# Patient Record
Sex: Male | Born: 1990 | Race: Black or African American | Hispanic: No | Marital: Married | Smoking: Never smoker
Health system: Southern US, Community
[De-identification: ages and names within clinical notes are randomized; demographics above are authoritative.]

## PROBLEM LIST (undated history)

## (undated) DIAGNOSIS — Z0289 Encounter for other administrative examinations: Secondary | ICD-10-CM

## (undated) HISTORY — DX: Encounter for other administrative examinations: Z02.89

---

## 2020-03-27 ENCOUNTER — Ambulatory Visit: Payer: Self-pay

## 2020-05-01 ENCOUNTER — Other Ambulatory Visit: Payer: Self-pay

## 2020-05-01 ENCOUNTER — Ambulatory Visit: Payer: Medicaid Other | Admitting: Family Medicine

## 2020-05-01 ENCOUNTER — Other Ambulatory Visit (HOSPITAL_COMMUNITY)
Admission: RE | Admit: 2020-05-01 | Discharge: 2020-05-01 | Disposition: A | Discharge status: Home / Self Care | Payer: Medicaid Other | Source: Ambulatory Visit | Attending: Family Medicine | Admitting: Family Medicine

## 2020-05-01 VITALS — BP 124/74 | HR 86 | Ht 68.0 in | Wt 200.2 lb

## 2020-05-01 DIAGNOSIS — Z0289 Encounter for other administrative examinations: Secondary | ICD-10-CM | POA: Diagnosis not present

## 2020-05-01 LAB — POCT URINALYSIS DIP (MANUAL ENTRY)
Bilirubin, UA: NEGATIVE
Blood, UA: NEGATIVE
Glucose, UA: NEGATIVE mg/dL
Ketones, POC UA: NEGATIVE mg/dL
Leukocytes, UA: NEGATIVE
Nitrite, UA: NEGATIVE
Protein Ur, POC: NEGATIVE mg/dL
Spec Grav, UA: 1.025 (ref 1.010–1.025)
Urobilinogen, UA: 0.2 E.U./dL
pH, UA: 6 (ref 5.0–8.0)

## 2020-05-01 NOTE — Patient Instructions (Addendum)
It was wonderful to see you today.   Today we talked about: --The COVID19 vaccine--at next visit!  - Consider riboflavin (a vitamin at 200 mg per day) or magnesium (200 mg per day) for your headaches   --Follow up on 9/29  At 2:45 PM     Thank you for choosing [facility].   Please call [phone] with any questions about today's appointment.  Please be sure to schedule follow up at the front  desk before you leave today.   Terisa Starr, MD  Family Medicine

## 2020-05-01 NOTE — Progress Notes (Signed)
Patient Name: Jesus Clay Date of Birth: 1991-05-15 Date of Visit: 05/01/20 PCP: Westley Chandler, MD  Chief Complaint: refugee intake examination and headaches  The patient's preferred language is [place]. An interpreter was used for the entire visit.  Interpreter Name or ID: Rayna Sexton     Subjective: Jesus Clay is a pleasant 29 y.o. presenting today for an initial refugee and immigrant clinic visit.    Headache: Patient reports intermittent headaches in the last four months lasting about a day occurring ~once per week along the right temple. He believes they were instigated by previously working multiple jobs. Worsened by loud noises and when trying to concentrate. Preceded by aura of tingling lights. He stays hydrated, and reports adequate sleep. He has not tried anything for it, and desires to limit medication use. Denies photophobia.   Fingernail pain: He reports pain in fingernails of both hands beginning three weeks ago, that started in one finger and spread to the rest. Endorses peeling of skin of fingers. Believes it is related to use of new soaps and detergent. Sensation of touch is intact. He consumes a varied, inclusive diet. Denies swimming in pool. No numbness or tingling. No shortness of breath, lightheadedness, or dizziness.      ROS:   No fevers, sob, cough, bleeding, chest pain, n/v, back pain, abdominal pain, dysuria, hematuria, lightheadedness, dizziness, urgency, frequency, diarrhea, constipation, anorexia.    PMH: None    PSH: None   FH: Mom: Diabetes Mellitus (diagnosed in late adulthood; presumably Type II)  Dad: Hypertension  Denies FHx of cancers  Current Medications:  None (No prescribed meds, vitamins, herbs, or OTCs)  Refugee Information Number of Immediate Family Members: 8 (Lives with 3 brothers, one sister, parents. Wife and son in [place]) Number of Immediate Family Members in [place]: 6 Date of Arrival: 04/04/20 Country of Birth:  [facility] Country of Origin:  ([place]) Duration in [place]: 20 years or greater (25) Reason for Leaving Home Country: Other Other Reason for Leaving Home Country:: war Primary Language: French Able to Read in Primary Language: Yes Able to Write in Primary Language: No (However writes in [place] and [place], some [place]) Education: [employer] Prior Work: Radiographer, therapeutic, Financial risk analyst, Leisure centre manager, Teacher, adult education, Interior and spatial designer Marital Status: Married Sexual Activity: Yes Tuberculosis Screening Overseas: Negative Tuberculosis Screening Health Department: Negative Health Department Labs Completed: Yes History of Trauma: None Do You Feel Jumpy or Nervous?: No Are You Very Watchful or 'Super Alert'?: No   Date of [place]verse[place] Exam: 12/15/19 Review of [place]verse[place] Exam: Today  Pre-Departure Treatment: Coarten, Praziquantel, Albendazole   Seen at HD---received vaccines 04/23/20    Vitals:   05/01/20 1337  BP: 124/74  Pulse: 86  SpO2: 100%   HEENT: Sclera anicteric. Dentition is appropriate . Appears well hydrated. Cardiac: Regular rate and rhythm. Normal S1/S2. No murmurs, rubs, or gallops appreciated. Lungs: Clear bilaterally to ascultation.  Abdomen: Normoactive bowel sounds. No tenderness to deep or light palpation. No rebound or guarding. Splenomegaly absent  Extremities: Warm, well perfused without edema. All fingertips erythematous, with scaling and peeling stopping just distal to DIP Skin: Warm and well perfused.  On bilateral hands there is keratolysis  No edema, no pain Keratolysis only present over DIP on all digits, no scaling No overlying flaking No involvement of dorsum of hand  Psych: Pleasant and appropriate  MSK: Normal ROM. 5/5 Strength Neuro: Alert and responsive. No focal deficits. Appropriate gait.    Roderick was seen today for establish care.  Diagnoses  and all orders for this visit:  Refugee health examination -     RPR -     HIV Antibody (routine testing w rflx) -      CBC with Differential/Platelet -     Comprehensive metabolic panel -     Varicella zoster antibody, IgG -     Hepatitis B surface antigen -     Hepatitis B core antibody, total -     Hepatitis B surface antibody,quantitative -     POCT urinalysis dipstick -     Lipid panel -     TSH -     Urine cytology ancillary only -     Strongyloides, Ab, IgG- given no ivermectin predeparture as from [place] endemic area  -     HCV Ab w Reflex to Quant PCR   Assessment and Plan  -Headaches: Likely migraines. No red flags concerning for malignancy, less likely tension, or medicatin overuse.  Patient to try OTC Riboflavin at 200 mg per day or Magnesium at 200 mg per day, track, and f/u at next visit.  -Exfoliative keratolysis, also query if dermatitis from irritant. Low concern for nutritional deficiencies. CBC, HIV, and RPR Today. Keep monitoring  Return to care in 1 month in [facility] with resident physician and PCP Dr. Terisa Starr.   Release of records signed for HD Discuss COVID vaccine at follow up   Vaccines: Discussed vaccine at length, regarding benefits, safety profile, types, and recommendations for COVID19 vaccine. Amenable to receiving at follow up.    Patient seen along with MS3 student Daryll Drown. I personally evaluated this patient along with the student, and verified all aspects of the history, physical exam, and medical decision making as documented by the student. I agree with the student's documentation and have made all necessary edits.  Terisa Starr, MD  Family Medicine Teaching Service

## 2020-05-02 ENCOUNTER — Other Ambulatory Visit: Payer: Medicaid Other

## 2020-05-02 ENCOUNTER — Other Ambulatory Visit: Payer: Self-pay | Admitting: Family Medicine

## 2020-05-02 DIAGNOSIS — Z0289 Encounter for other administrative examinations: Secondary | ICD-10-CM

## 2020-05-03 ENCOUNTER — Encounter: Payer: Self-pay | Admitting: Family Medicine

## 2020-05-03 LAB — URINE CYTOLOGY ANCILLARY ONLY
Chlamydia: NEGATIVE
Comment: NEGATIVE
Comment: NORMAL
Neisseria Gonorrhea: NEGATIVE

## 2020-05-04 LAB — LIPID PANEL
Chol/HDL Ratio: 5.1 ratio — ABNORMAL HIGH (ref 0.0–5.0)
Cholesterol, Total: 204 mg/dL — ABNORMAL HIGH (ref 100–199)
HDL: 40 mg/dL (ref >39)
LDL Chol Calc (NIH): 147 mg/dL — ABNORMAL HIGH (ref 0–99)
Triglycerides: 96 mg/dL (ref 0–149)
VLDL Cholesterol Cal: 17 mg/dL (ref 5–40)

## 2020-05-04 LAB — COMPREHENSIVE METABOLIC PANEL
ALT: 41 IU/L (ref 0–44)
AST: 24 IU/L (ref 0–40)
Albumin/Globulin Ratio: 2 (ref 1.2–2.2)
Albumin: 4.7 g/dL (ref 4.1–5.2)
Alkaline Phosphatase: 80 IU/L (ref 48–121)
BUN/Creatinine Ratio: 10 (ref 9–20)
BUN: 11 mg/dL (ref 6–20)
Bilirubin Total: 0.2 mg/dL (ref 0.0–1.2)
CO2: 20 mmol/L (ref 20–29)
Calcium: 9.4 mg/dL (ref 8.7–10.2)
Chloride: 103 mmol/L (ref 96–106)
Creatinine, Ser: 1.12 mg/dL (ref 0.76–1.27)
GFR calc Af Amer: 104 mL/min/{1.73_m2} (ref >59)
GFR calc non Af Amer: 90 mL/min/{1.73_m2} (ref >59)
Globulin, Total: 2.4 g/dL (ref 1.5–4.5)
Glucose: 104 mg/dL — ABNORMAL HIGH (ref 65–99)
Potassium: 4 mmol/L (ref 3.5–5.2)
Sodium: 138 mmol/L (ref 134–144)
Total Protein: 7.1 g/dL (ref 6.0–8.5)

## 2020-05-04 LAB — CBC WITH DIFFERENTIAL/PLATELET
Basophils Absolute: 0 10*3/uL (ref 0.0–0.2)
Basos: 1 %
EOS (ABSOLUTE): 0.1 10*3/uL (ref 0.0–0.4)
Eos: 3 %
Hematocrit: 43.8 % (ref 37.5–51.0)
Hemoglobin: 15.1 g/dL (ref 13.0–17.7)
Immature Grans (Abs): 0 10*3/uL (ref 0.0–0.1)
Immature Granulocytes: 0 %
Lymphocytes Absolute: 2.2 10*3/uL (ref 0.7–3.1)
Lymphs: 46 %
MCH: 30.1 pg (ref 26.6–33.0)
MCHC: 34.5 g/dL (ref 31.5–35.7)
MCV: 87 fL (ref 79–97)
Monocytes Absolute: 0.4 10*3/uL (ref 0.1–0.9)
Monocytes: 9 %
Neutrophils Absolute: 2 10*3/uL (ref 1.4–7.0)
Neutrophils: 41 %
Platelets: 235 10*3/uL (ref 150–450)
RBC: 5.02 x10E6/uL (ref 4.14–5.80)
RDW: 13.6 % (ref 11.6–15.4)
WBC: 4.8 10*3/uL (ref 3.4–10.8)

## 2020-05-04 LAB — TSH: TSH: 1.43 u[IU]/mL (ref 0.450–4.500)

## 2020-05-04 LAB — HIV ANTIBODY (ROUTINE TESTING W REFLEX): HIV Screen 4th Generation wRfx: NONREACTIVE

## 2020-05-04 LAB — HEPATITIS B SURFACE ANTIBODY, QUANTITATIVE: Hepatitis B Surf Ab Quant: >1000 m[IU]/mL (ref >9.9)

## 2020-05-04 LAB — HCV INTERPRETATION

## 2020-05-04 LAB — HEPATITIS B SURFACE ANTIGEN: Hepatitis B Surface Ag: NEGATIVE

## 2020-05-04 LAB — HCV AB W REFLEX TO QUANT PCR: HCV Ab: <0.1 s/co ratio (ref 0.0–0.9)

## 2020-05-04 LAB — STRONGYLOIDES, AB, IGG: Strongyloides, Ab, IgG: NEGATIVE

## 2020-05-04 LAB — VARICELLA ZOSTER ANTIBODY, IGG: Varicella zoster IgG: <135 index — ABNORMAL LOW (ref >165)

## 2020-05-04 LAB — HEPATITIS B CORE ANTIBODY, TOTAL: Hep B Core Total Ab: NEGATIVE

## 2020-05-04 LAB — RPR: RPR Ser Ql: NONREACTIVE

## 2020-05-11 ENCOUNTER — Encounter: Payer: Self-pay | Admitting: Family Medicine

## 2020-05-21 ENCOUNTER — Encounter: Payer: Self-pay | Admitting: Family Medicine

## 2020-05-21 DIAGNOSIS — R7612 Nonspecific reaction to cell mediated immunity measurement of gamma interferon antigen response without active tuberculosis: Secondary | ICD-10-CM | POA: Insufficient documentation

## 2020-05-30 ENCOUNTER — Other Ambulatory Visit: Payer: Self-pay

## 2020-05-30 ENCOUNTER — Ambulatory Visit (INDEPENDENT_AMBULATORY_CARE_PROVIDER_SITE_OTHER): Payer: Medicaid Other | Admitting: Family Medicine

## 2020-05-30 ENCOUNTER — Encounter: Payer: Self-pay | Admitting: Family Medicine

## 2020-05-30 VITALS — BP 122/80 | HR 96 | Ht 67.99 in | Wt 199.8 lb

## 2020-05-30 DIAGNOSIS — Z23 Encounter for immunization: Secondary | ICD-10-CM

## 2020-05-30 DIAGNOSIS — Z09 Encounter for follow-up examination after completed treatment for conditions other than malignant neoplasm: Secondary | ICD-10-CM | POA: Insufficient documentation

## 2020-05-30 NOTE — Assessment & Plan Note (Addendum)
Headaches -Resolved; patient is non-medicated  Finger rash/pain -Resolved  Immunizatons: -Varicella administered today -COVID offered; patient declined, will make follow up appt when ready to receive vaccine

## 2020-05-30 NOTE — Patient Instructions (Addendum)
It was wonderful to see you today.  Today we talked about:  Your headaches they have resolved as well as the rash on your hand.  If this returns please let us know.  Today you received your varicella vaccine.  Please be sure to schedule follow up for your Covid vaccine at the front  desk before you leave today.   Please call the clinic at [phone] if you have any concerns. It was our pleasure to serve you.  Dr. Salvadore Dom

## 2020-05-30 NOTE — Progress Notes (Signed)
    SUBJECTIVE:   CHIEF COMPLAINT / HPI:   Jesus Clay is a 29 yo M who presents for follow up; no concerns today.  Headaches Have resolved. Denies vision changes or neck stiffness. Not currently taking any medication.  Digital rash Has resolved on its own with the last few weeks.   Immunizations Not sure if he has received the varicella vaccine.  PERTINENT  PMH / PSH: Refugee (Health examination with Dr. 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 Age appropriate. PHQ9 SCORE ONLY 05/30/2020 05/01/2020  PHQ-9 Total Score 1 4   ASSESSMENT/PLAN:   Follow up Headaches -Resolved; patient is non-medicated  Finger rash/pain -Resolved  Immunizatons: -Varicella administered today -COVID offered; patient declined, will make follow up appt when ready to receive vaccine   Lavonda Jumbo, DO [facility]

## 2020-06-29 ENCOUNTER — Other Ambulatory Visit: Payer: Self-pay | Admitting: Obstetrics and Gynecology

## 2020-06-29 ENCOUNTER — Other Ambulatory Visit: Payer: Self-pay

## 2020-06-29 ENCOUNTER — Ambulatory Visit
Admission: RE | Admit: 2020-06-29 | Discharge: 2020-06-29 | Disposition: A | Discharge status: Home / Self Care | Payer: Medicaid Other | Source: Ambulatory Visit | Attending: Obstetrics and Gynecology | Admitting: Obstetrics and Gynecology

## 2020-06-29 DIAGNOSIS — R7611 Nonspecific reaction to tuberculin skin test without active tuberculosis: Secondary | ICD-10-CM

## 2020-09-02 ENCOUNTER — Other Ambulatory Visit: Payer: Self-pay

## 2020-09-02 ENCOUNTER — Emergency Department (HOSPITAL_COMMUNITY)
Admission: EM | Admit: 2020-09-02 | Discharge: 2020-09-02 | Disposition: A | Discharge status: Home / Self Care | Payer: Medicaid Other | Attending: Emergency Medicine | Admitting: Emergency Medicine

## 2020-09-02 ENCOUNTER — Encounter (HOSPITAL_COMMUNITY): Payer: Self-pay | Admitting: Emergency Medicine

## 2020-09-02 ENCOUNTER — Ambulatory Visit (HOSPITAL_COMMUNITY)
Admission: EM | Admit: 2020-09-02 | Discharge: 2020-09-02 | Disposition: A | Discharge status: Home / Self Care | Payer: Medicaid Other | Attending: Family Medicine | Admitting: Family Medicine

## 2020-09-02 DIAGNOSIS — Z5321 Procedure and treatment not carried out due to patient leaving prior to being seen by health care provider: Secondary | ICD-10-CM | POA: Insufficient documentation

## 2020-09-02 DIAGNOSIS — H66002 Acute suppurative otitis media without spontaneous rupture of ear drum, left ear: Secondary | ICD-10-CM

## 2020-09-02 DIAGNOSIS — H9201 Otalgia, right ear: Secondary | ICD-10-CM | POA: Insufficient documentation

## 2020-09-02 MED ORDER — AMOXICILLIN-POT CLAVULANATE 875-125 MG PO TABS: 1.0000 | ORAL_TABLET | Freq: Two times a day (BID) | ORAL | 0 refills | Status: DC

## 2020-09-02 MED ORDER — IBUPROFEN 800 MG PO TABS: 800.0000 mg | ORAL_TABLET | Freq: Three times a day (TID) | ORAL | 0 refills | Status: DC | PRN

## 2020-09-02 MED ORDER — ACETAMINOPHEN 500 MG PO TABS: 500.0000 mg | ORAL_TABLET | Freq: Four times a day (QID) | ORAL | 0 refills | Status: AC | PRN

## 2020-09-02 NOTE — ED Triage Notes (Signed)
Patient complains of right ear x2 months that became more severe yesterday.

## 2020-09-02 NOTE — ED Provider Notes (Signed)
[facility]    CSN: [id number] Arrival date & time: 09/02/20  1623      History   Chief Complaint Chief Complaint  Patient presents with  . Ear Pain    HPI Jesus Clay is a 30 y.o. male.   Here today with 1 month of right ear pain and muffled hearing, now with associated fever, jaw pain and headache on that side as well. Denies drainage from the ear, injury to the area, hx of ear issues. Has not been taking anything OTC for sxs. No known sick contacts.      Past Medical History:  Diagnosis Date  . Refugee health examination     Patient Active Problem List   Diagnosis Date Noted  . Follow up 05/30/2020  . 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 Refugee health examination 05/01/2020  . Morbid obesity (HCC) 05/01/2020    History reviewed. No pertinent surgical history.     Home Medications    Prior to Admission medications   Medication Sig Start Date End Date Taking? Authorizing Provider  acetaminophen (TYLENOL) 500 MG tablet Take 1 tablet (500 mg total) by mouth every 6 (six) hours as needed. 09/02/20  Yes Particia Nearing, PA-C  amoxicillin-clavulanate (AUGMENTIN) 875-125 MG tablet Take 1 tablet by mouth every 12 (twelve) hours. 09/02/20  Yes Particia Nearing, PA-C  ibuprofen (ADVIL) 800 MG tablet Take 1 tablet (800 mg total) by mouth every 8 (eight) hours as needed. 09/02/20  Yes Particia Nearing, PA-C    Family History Family History  Problem Relation Age of Onset  . Diabetes type II Mother   . Hypertension Father     Social History Social History   Tobacco Use  . Smoking status: Never Smoker  . Smokeless tobacco: Never Used  Substance Use Topics  . Alcohol use: Never  . Drug use: Never     Allergies   Patient has no known allergies.   Review of Systems Review of Systems PER HPI   Physical Exam Triage Vital Signs ED Triage Vitals  Enc Vitals Group     BP 09/02/20 1729 125/79     Pulse Rate  09/02/20 1729 (!) 55     Resp 09/02/20 1729 14     Temp 09/02/20 1729 99.6 F (37.6 C)     Temp Source 09/02/20 1729 Oral     SpO2 09/02/20 1729 99 %     Weight --      Height --      Head Circumference --      Peak Flow --      Pain Score 09/02/20 1730 8     Pain Loc --      Pain Edu? --      Excl. in GC? --    No data found.  Updated Vital Signs BP 125/79 (BP Location: Right Arm)   Pulse (!) 55   Temp 99.6 F (37.6 C) (Oral)   Resp 14   SpO2 99%   Visual Acuity Right Eye Distance:   Left Eye Distance:   Bilateral Distance:    Right Eye Near:   Left Eye Near:    Bilateral Near:     Physical Exam Vitals and nursing note reviewed.  Constitutional:      Appearance: Normal appearance.  HENT:     Head: Atraumatic.     Left Ear: Tympanic membrane normal.     Ears:     Comments: Right EAC edematous and mildly  ttp, no discharge or flaking. Right TM bulging and erythematous but intact    Nose: Nose normal.     Mouth/Throat:     Mouth: Mucous membranes are moist.     Pharynx: Oropharynx is clear.  Eyes:     Extraocular Movements: Extraocular movements intact.     Conjunctiva/sclera: Conjunctivae normal.  Cardiovascular:     Rate and Rhythm: Normal rate and regular rhythm.  Pulmonary:     Effort: Pulmonary effort is normal.     Breath sounds: Normal breath sounds.  Musculoskeletal:        General: Normal range of motion.     Cervical back: Normal range of motion and neck supple.  Skin:    General: Skin is warm and dry.  Neurological:     General: No focal deficit present.     Mental Status: He is oriented to person, place, and time.  Psychiatric:        Mood and Affect: Mood normal.        Thought Content: Thought content normal.        Judgment: Judgment normal.      UC Treatments / Results  Labs (all labs ordered are listed, but only abnormal results are displayed) Labs Reviewed - No data to display  EKG   Radiology No results  found.  Procedures Procedures (including critical care time)  Medications Ordered in UC Medications - No data to display  Initial Impression / Assessment and Plan / UC Course  I have reviewed the triage vital signs and the nursing notes.  Pertinent labs & imaging results that were available during my care of the patient were reviewed by me and considered in my medical decision making (see chart for details).     Suspect both otitis externa and media of right side. Will treat with augmentin, OTC pain relievers, rest. WOrk note given. Return for re-evaluation if not resolving.   Final Clinical Impressions(s) / UC Diagnoses   Final diagnoses:  Non-recurrent acute suppurative otitis media of left ear without spontaneous rupture of tympanic membrane   Discharge Instructions   None    ED Prescriptions    Medication Sig Dispense Auth. Provider   amoxicillin-clavulanate (AUGMENTIN) 875-125 MG tablet Take 1 tablet by mouth every 12 (twelve) hours. 20 tablet Particia Nearing, [place]   ibuprofen (ADVIL) 800 MG tablet Take 1 tablet (800 mg total) by mouth every 8 (eight) hours as needed. 21 tablet Particia Nearing, [place]   acetaminophen (TYLENOL) 500 MG tablet Take 1 tablet (500 mg total) by mouth every 6 (six) hours as needed. 30 tablet Particia Nearing, [place]     PDMP not reviewed this encounter.   Particia Nearing, [place] 09/02/20 1946

## 2020-09-02 NOTE — ED Triage Notes (Signed)
PT C/O: right ear pain onset 1 month associated w/headache, fever and is now having trouble opening his mouth  Hurts to sleep on his right side  Pt also taking meds for TB  DENIES: v/n/d  TAKING MEDS: none  A&O x4... NAD... Ambulatory

## 2021-01-02 IMAGING — CR DG CHEST 2V
2 series · 2 of 2 positions shown · non-contrast
Comparison: None.

CLINICAL DATA: Positive TB testing, no complaints.

EXAM:
CHEST - 2 VIEW

[w chest pa]
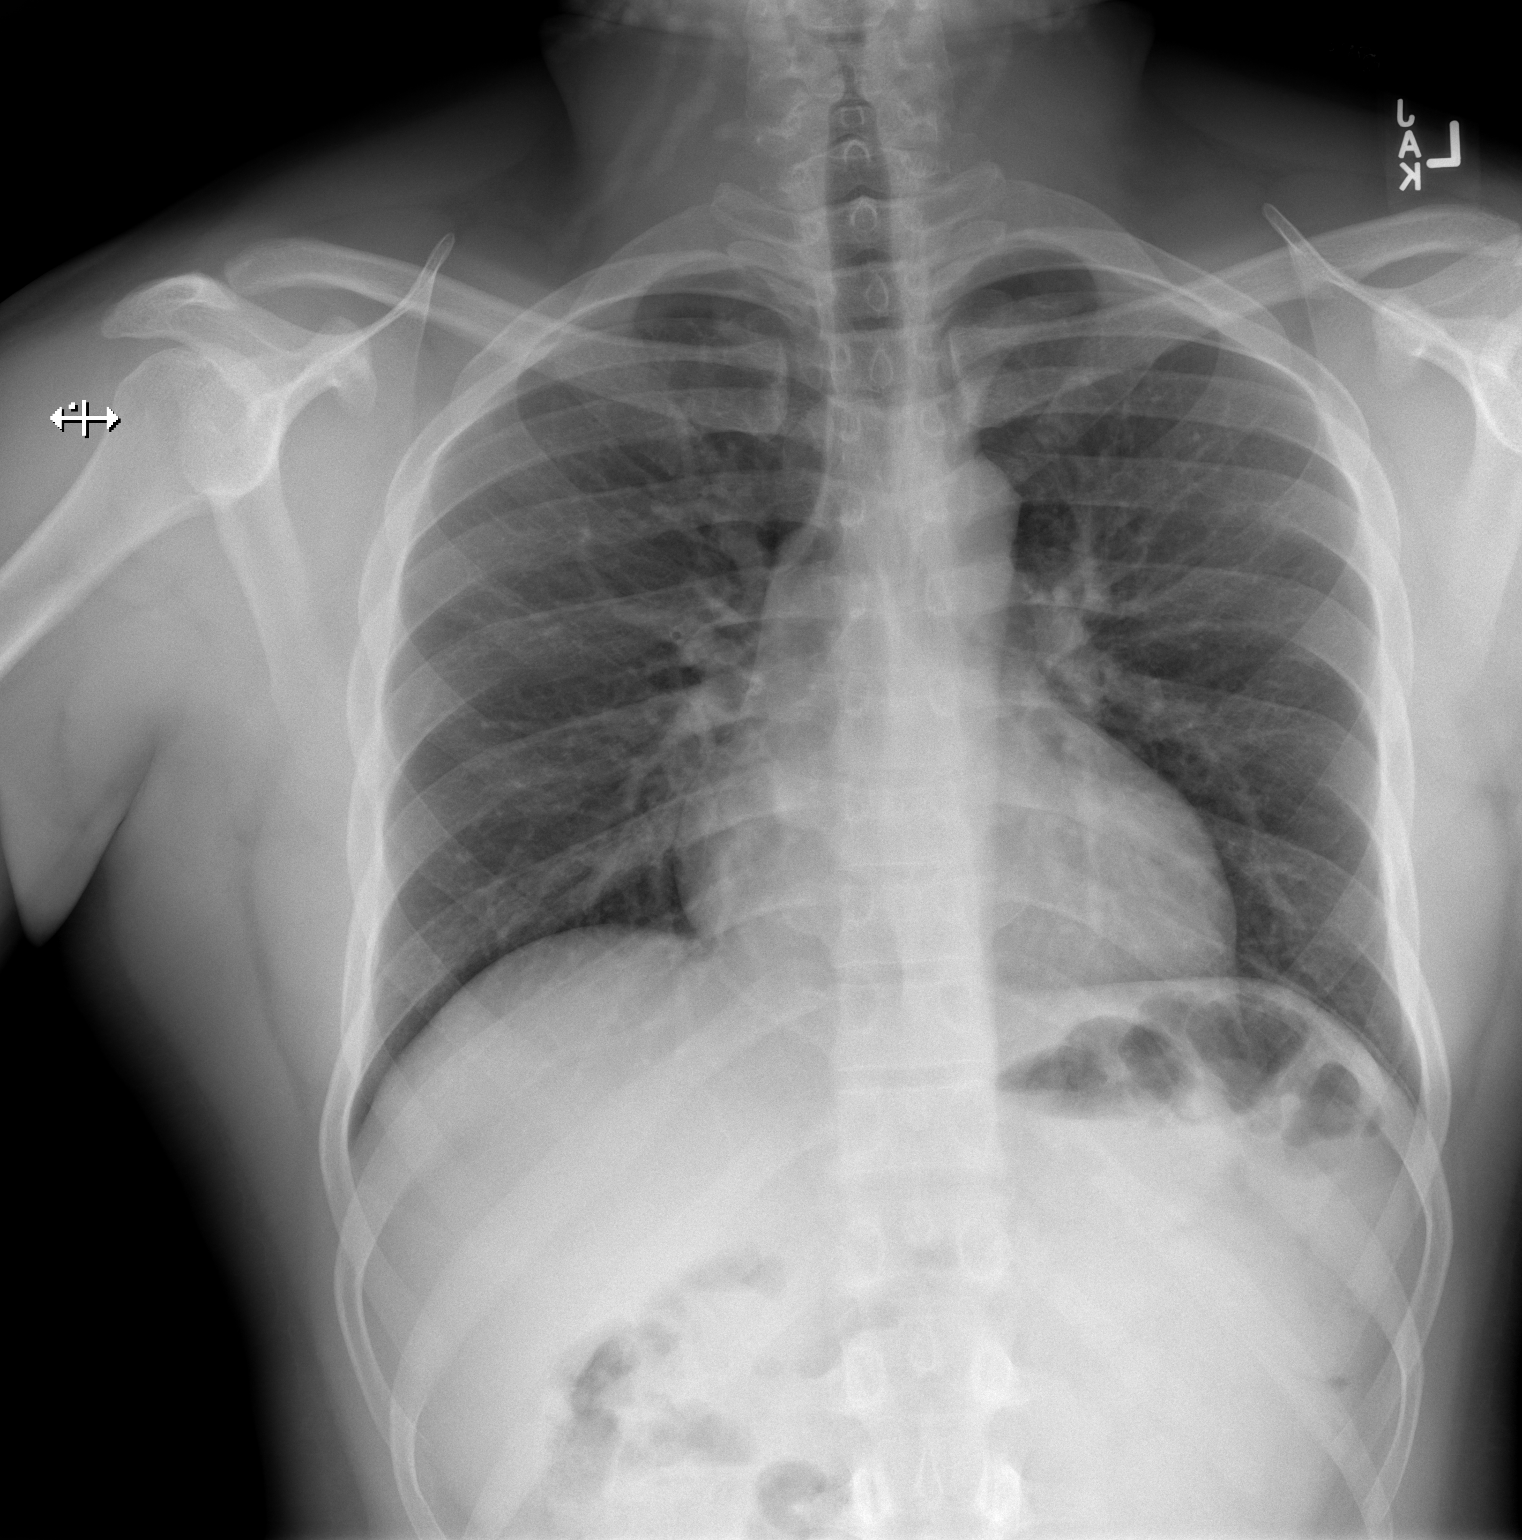

[w chest lat]
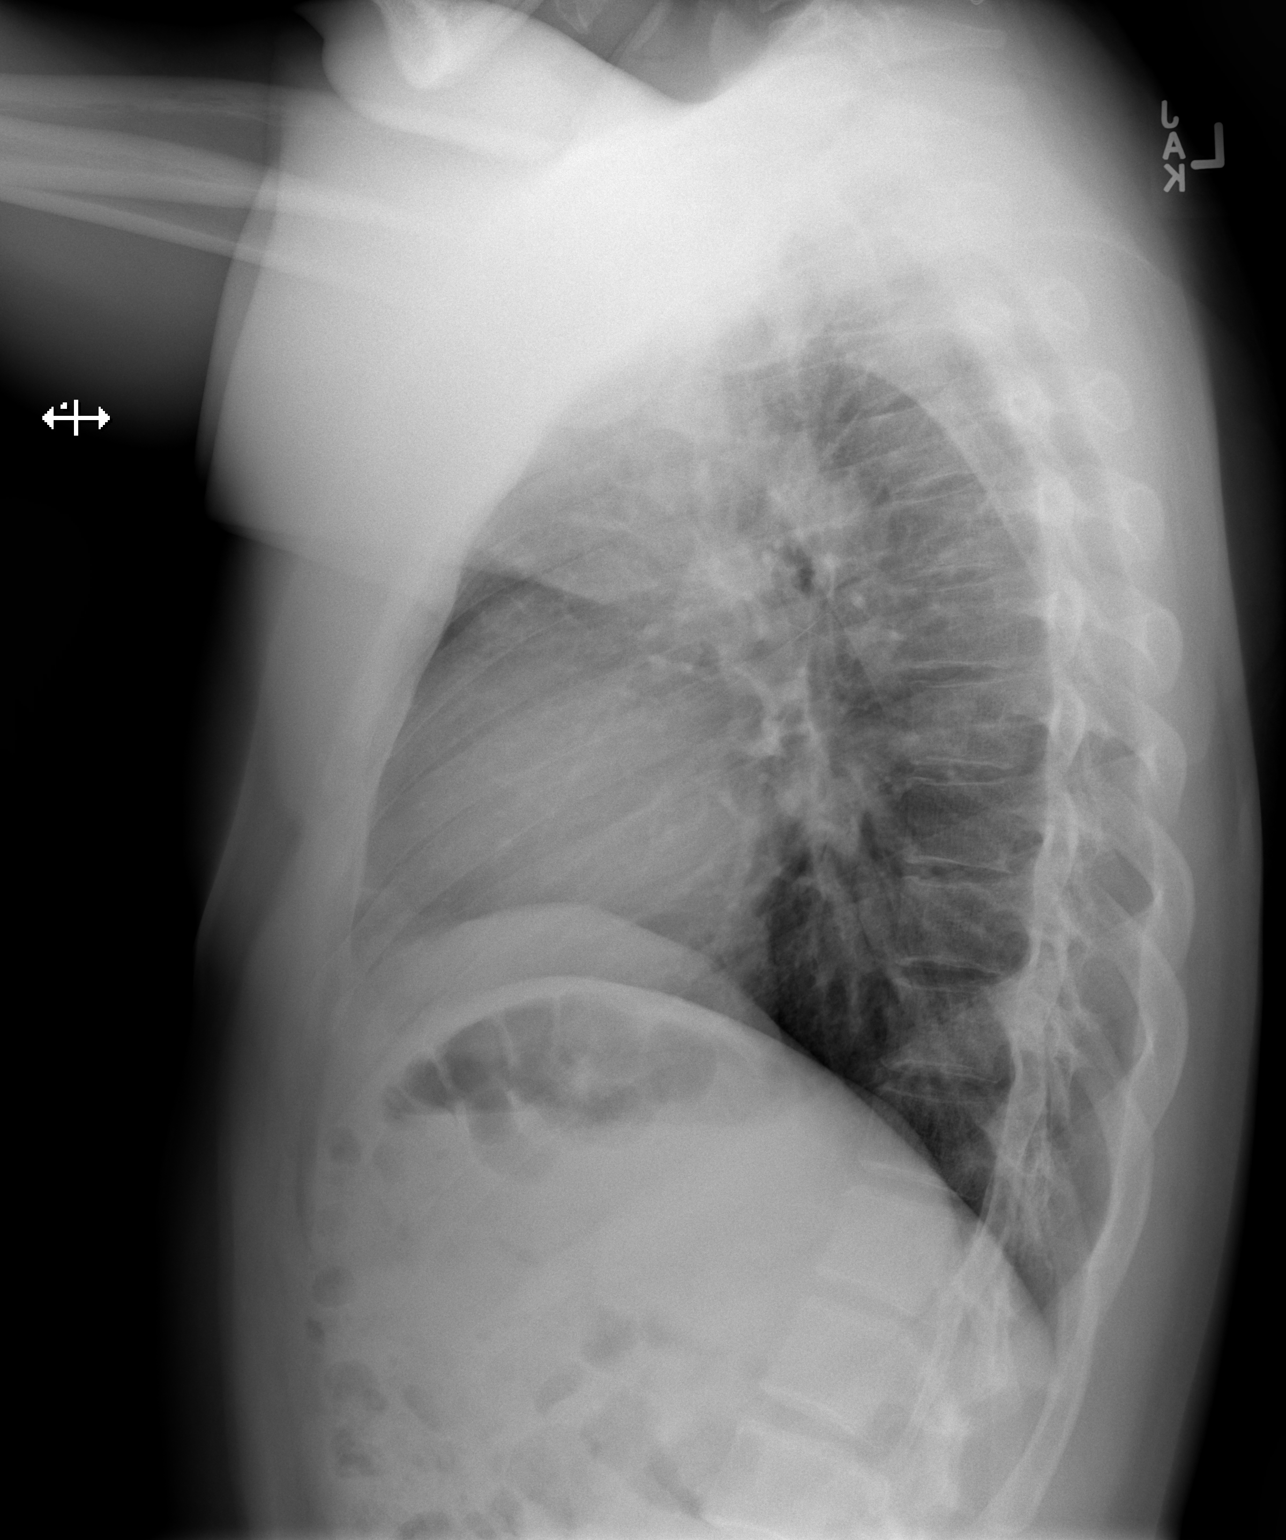

[2 of 2 positions shown; findings below may reference images not displayed]

FINDINGS: No convincing radiographic sequela of prior granulomatous disease.
No consolidation, features of edema, pneumothorax, or effusion.
Pulmonary vascularity is normally distributed. The cardiomediastinal
contours are unremarkable. No acute osseous or soft tissue
abnormality.
IMPRESSION: No acute cardiopulmonary abnormality. No radiographic evidence of
prior granulomatous disease.

## 2021-01-04 ENCOUNTER — Encounter (HOSPITAL_COMMUNITY): Payer: Self-pay

## 2021-01-04 ENCOUNTER — Ambulatory Visit (HOSPITAL_COMMUNITY)
Admission: RE | Admit: 2021-01-04 | Discharge: 2021-01-04 | Disposition: A | Discharge status: Home / Self Care | Payer: BC Managed Care – PPO | Source: Ambulatory Visit | Attending: Emergency Medicine | Admitting: Emergency Medicine

## 2021-01-04 ENCOUNTER — Other Ambulatory Visit: Payer: Self-pay

## 2021-01-04 VITALS — BP 120/67 | HR 67 | Temp 98.5°F | Resp 17

## 2021-01-04 DIAGNOSIS — H6690 Otitis media, unspecified, unspecified ear: Secondary | ICD-10-CM | POA: Diagnosis not present

## 2021-01-04 DIAGNOSIS — H60501 Unspecified acute noninfective otitis externa, right ear: Secondary | ICD-10-CM

## 2021-01-04 MED ORDER — NEOMYCIN-POLYMYXIN-HC 3.5-10000-1 OT SUSP: 3.0000 [drp] | Freq: Three times a day (TID) | OTIC | 0 refills | Status: AC

## 2021-01-04 MED ORDER — AMOXICILLIN-POT CLAVULANATE 875-125 MG PO TABS: 1.0000 | ORAL_TABLET | Freq: Two times a day (BID) | ORAL | 0 refills | Status: AC

## 2021-01-04 NOTE — ED Triage Notes (Signed)
Pt presents recurring right side ear pain.

## 2021-01-04 NOTE — ED Provider Notes (Signed)
[facility]    CSN: [id number] Arrival date & time: 01/04/21  1607      History   Chief Complaint Chief Complaint  Patient presents with  . APPOINTMENT: Ear Pain    HPI Jesus Clay is a 30 y.o. male.   Jesus Clay presents with complaints of right ear pain which started two days ago. No drainage from the ear. Pain radiates to head and neck. Feels similar to episode a few months ago with similar, antibiotics helped with pain. No specific dental pain. Some decreased hearing. No recent URI symptoms.    ROS per HPI, negative if not otherwise mentioned.      Past Medical History:  Diagnosis Date  . Refugee health examination     Patient Active Problem List   Diagnosis Date Noted  . Follow up 05/30/2020  . 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 Refugee health examination 05/01/2020  . Morbid obesity (HCC) 05/01/2020    History reviewed. No pertinent surgical history.     Home Medications    Prior to Admission medications   Medication Sig Start Date End Date Taking? Authorizing Provider  neomycin-polymyxin-hydrocortisone (CORTISPORIN) 3.5-10000-1 OTIC suspension Place 3 drops into the right ear 3 (three) times daily for 10 days. 01/04/21 01/14/21 Yes Jiaire Rosebrook, Barron Alvine, NP  acetaminophen (TYLENOL) 500 MG tablet Take 1 tablet (500 mg total) by mouth every 6 (six) hours as needed. 09/02/20   Particia Nearing, PA-C  amoxicillin-clavulanate (AUGMENTIN) 875-125 MG tablet Take 1 tablet by mouth every 12 (twelve) hours for 5 days. 01/04/21 01/09/21  Georgetta Haber, NP  ibuprofen (ADVIL) 800 MG tablet Take 1 tablet (800 mg total) by mouth every 8 (eight) hours as needed. 09/02/20   Particia Nearing, PA-C    Family History Family History  Problem Relation Age of Onset  . Diabetes type II Mother   . Hypertension Father     Social History Social History   Tobacco Use  . Smoking status: Never Smoker  . Smokeless tobacco: Never Used   Substance Use Topics  . Alcohol use: Never  . Drug use: Never     Allergies   Patient has no known allergies.   Review of Systems Review of Systems   Physical Exam Triage Vital Signs ED Triage Vitals  Enc Vitals Group     BP 01/04/21 1635 120/67     Pulse Rate 01/04/21 1635 67     Resp 01/04/21 1635 17     Temp 01/04/21 1635 98.5 F (36.9 C)     Temp Source 01/04/21 1635 Oral     SpO2 01/04/21 1635 99 %     Weight --      Height --      Head Circumference --      Peak Flow --      Pain Score 01/04/21 1634 6     Pain Loc --      Pain Edu? --      Excl. in GC? --    No data found.  Updated Vital Signs BP 120/67 (BP Location: Left Arm)   Pulse 67   Temp 98.5 F (36.9 C) (Oral)   Resp 17   SpO2 99%   Visual Acuity Right Eye Distance:   Left Eye Distance:   Bilateral Distance:    Right Eye Near:   Left Eye Near:    Bilateral Near:     Physical Exam Constitutional:      Appearance: He  is well-developed.  HENT:     Right Ear: Tenderness present. Tympanic membrane is injected and bulging.     Left Ear: Tympanic membrane is injected.     Ears:     Comments: Right ear canal is red and tender with mild swelling. No drainage. Red and mildly bulging right TM, left appears injected as well Cardiovascular:     Rate and Rhythm: Normal rate.  Pulmonary:     Effort: Pulmonary effort is normal.  Skin:    General: Skin is warm and dry.  Neurological:     Mental Status: He is alert and oriented to person, place, and time.      UC Treatments / Results  Labs (all labs ordered are listed, but only abnormal results are displayed) Labs Reviewed - No data to display  EKG   Radiology No results found.  Procedures Procedures (including critical care time)  Medications Ordered in UC Medications - No data to display  Initial Impression / Assessment and Plan / UC Course  I have reviewed the triage vital signs and the nursing notes.  Pertinent labs &  imaging results that were available during my care of the patient were reviewed by me and considered in my medical decision making (see chart for details).     Apparent otitis externa with mild AOM on exam with oral and topical medications provided today. Return precautions provided. Patient verbalized understanding and agreeable to plan.   Final Clinical Impressions(s) / UC Diagnoses   Final diagnoses:  Acute otitis externa of right ear, unspecified type  Acute otitis media, unspecified otitis media type     Discharge Instructions     5 days of oral antibiotics as well as 10 days of ear drops.  If symptoms worsen or do not improve in the next week to return to be seen or to follow up with your PCP.     ED Prescriptions    Medication Sig Dispense Auth. Provider   amoxicillin-clavulanate (AUGMENTIN) 875-125 MG tablet Take 1 tablet by mouth every 12 (twelve) hours for 5 days. 10 tablet Linus Mako B, NP   neomycin-polymyxin-hydrocortisone (CORTISPORIN) 3.5-10000-1 OTIC suspension Place 3 drops into the right ear 3 (three) times daily for 10 days. 10 mL Georgetta Haber, NP     PDMP not reviewed this encounter.   Georgetta Haber, NP 01/04/21 1658

## 2021-01-04 NOTE — Discharge Instructions (Signed)
5 days of oral antibiotics as well as 10 days of ear drops.  If symptoms worsen or do not improve in the next week to return to be seen or to follow up with your PCP.

## 2021-08-19 ENCOUNTER — Other Ambulatory Visit: Payer: Self-pay

## 2021-08-19 ENCOUNTER — Encounter (HOSPITAL_COMMUNITY): Payer: Self-pay

## 2021-08-19 ENCOUNTER — Ambulatory Visit (HOSPITAL_COMMUNITY)
Admission: EM | Admit: 2021-08-19 | Discharge: 2021-08-19 | Disposition: A | Discharge status: Home / Self Care | Payer: BC Managed Care – PPO | Attending: Internal Medicine | Admitting: Internal Medicine

## 2021-08-19 DIAGNOSIS — U071 COVID-19: Secondary | ICD-10-CM | POA: Insufficient documentation

## 2021-08-19 DIAGNOSIS — B9789 Other viral agents as the cause of diseases classified elsewhere: Secondary | ICD-10-CM | POA: Diagnosis not present

## 2021-08-19 DIAGNOSIS — R531 Weakness: Secondary | ICD-10-CM | POA: Diagnosis present

## 2021-08-19 DIAGNOSIS — J069 Acute upper respiratory infection, unspecified: Secondary | ICD-10-CM | POA: Diagnosis not present

## 2021-08-19 DIAGNOSIS — M60009 Infective myositis, unspecified site: Secondary | ICD-10-CM

## 2021-08-19 LAB — RESPIRATORY PANEL BY PCR

## 2021-08-19 NOTE — ED Triage Notes (Signed)
Pt presents to the office today for weakness,sore throat and fatigue x 1 week.

## 2021-08-19 NOTE — ED Provider Notes (Signed)
[facility]    CSN: [id number] Arrival date & time: 08/19/21  1546      History   Chief Complaint Chief Complaint  Patient presents with   Weakness    FATIGUE     HPI Jesus Clay is a 30 y.o. male.   30 year old male presents with a 4-day history of fatigue, myalgias, and sore throat.  He states that it started out on Friday, and was sent home from work.  He works in a [employer], [employer].  He states on Saturday it got worse and his whole body hurt to get out of bed.  He reports yesterday and today symptoms have been mildly improving.  He reports his throat is no longer sore.  He has been taking over-the-counter medication with mild relief.  He denies a fever.  He denies any known sick contacts.  He denies severe cough, shortness of breath, chest pain.   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 No pertinent surgical history.     Home Medications    Prior to Admission medications   Medication Sig Start Date End Date Taking? Authorizing Provider  acetaminophen (TYLENOL) 500 MG tablet Take 1 tablet (500 mg total) by mouth every 6 (six) hours as needed. 09/02/20   Particia Nearing, PA-C  ibuprofen (ADVIL) 800 MG tablet Take 1 tablet (800 mg total) by mouth every 8 (eight) hours as needed. 09/02/20   Particia Nearing, PA-C    Family History Family History  Problem Relation Age of Onset   Diabetes type II Mother    Hypertension Father     Social History Social History   Tobacco Use   Smoking status: Never   Smokeless tobacco: Never  Substance Use Topics   Alcohol use: Never   Drug use: Never     Allergies    Patient has no known allergies.   Review of Systems Review of Systems  Constitutional:  Positive for fatigue. Negative for appetite change, chills and fever.  HENT:  Negative for congestion, ear discharge, ear pain, postnasal drip, rhinorrhea, sinus pressure, sinus pain, sneezing and sore throat (resolved).   Respiratory:  Negative for cough, chest tightness and wheezing.   Cardiovascular:  Negative for chest pain.  Neurological:  Positive for weakness.  All other systems reviewed and are negative.   Physical Exam Triage Vital Signs ED Triage Vitals [08/19/21 1634]  Enc Vitals Group     BP (!) 150/98     Pulse Rate 72     Resp 18     Temp 98.7 F (37.1 C)     Temp Source Oral     SpO2 100 %     Weight      Height      Head Circumference      Peak Flow      Pain Score 6     Pain Loc      Pain Edu?      Excl. in GC?    No data found.  Updated Vital Signs BP (!) 150/98 (BP Location: Left Arm)    Pulse 72    Temp 98.7  F (37.1 C) (Oral)    Resp 18    SpO2 100%   Visual Acuity Right Eye Distance:   Left Eye Distance:   Bilateral Distance:    Right Eye Near:   Left Eye Near:    Bilateral Near:     Physical Exam Vitals and nursing note reviewed.  Constitutional:      General: He is not in acute distress.    Appearance: Normal appearance. He is well-developed and normal weight. He is not ill-appearing or toxic-appearing.  HENT:     Head: Normocephalic and atraumatic.     Right Ear: Tympanic membrane, ear canal and external ear normal. There is no impacted cerumen.     Left Ear: Tympanic membrane, ear canal and external ear normal. There is no impacted cerumen.     Nose: No congestion or rhinorrhea.     Mouth/Throat:     Mouth: Mucous membranes are moist.     Pharynx: Oropharynx is clear. No oropharyngeal exudate or posterior oropharyngeal erythema.  Eyes:     General:        Right eye: No discharge.        Left eye: No discharge.     Extraocular Movements:  Extraocular movements intact.     Conjunctiva/sclera: Conjunctivae normal.     Pupils: Pupils are equal, round, and reactive to light.  Cardiovascular:     Rate and Rhythm: Normal rate and regular rhythm.     Pulses: Normal pulses.     Heart sounds: Normal heart sounds. No murmur heard. Pulmonary:     Effort: Pulmonary effort is normal. No respiratory distress.     Breath sounds: Normal breath sounds. No wheezing, rhonchi or rales.  Chest:     Chest wall: No tenderness.  Abdominal:     Palpations: Abdomen is soft.     Tenderness: There is no abdominal tenderness.  Musculoskeletal:        General: No swelling.     Cervical back: Normal range of motion and neck supple.  Skin:    General: Skin is warm and dry.     Capillary Refill: Capillary refill takes less than 2 seconds.  Neurological:     Mental Status: He is alert.  Psychiatric:        Mood and Affect: Mood normal.     UC Treatments / Results  Labs (all labs ordered are listed, but only abnormal results are displayed) Labs Reviewed  RESPIRATORY PANEL BY PCR  SARS CORONAVIRUS 2 (TAT 6-24 HRS)    EKG   Radiology No results found.  Procedures Procedures (including critical care time)  Medications Ordered in UC Medications - No data to display  Initial Impression / Assessment and Plan / UC Course  I have reviewed the triage vital signs and the nursing notes.  Pertinent labs & imaging results that were available during my care of the patient were reviewed by me and considered in my medical decision making (see chart for details).  Clinical Course as of 08/19/21 1655  Mon Aug 19, 2021  1645 RECHECK BP 130/82 [WC]  1645 RECHECK PULSE OX: 98% [WC]    Clinical Course User Index [WC] Mileydi Milsap L, PA    Viral URI - improving. All vitals rechecked and WNL. Will obtain swabs to determine safety to return to work. Flu not tested as pt outside of treatment window Myositis - likely secondary to #1. REST.  Hydration.  Final Clinical Impressions(s) / UC Diagnoses   Final  diagnoses:  Viral myositis  Viral upper respiratory illness     Discharge Instructions      Increase fluids and rest. Monitor for improvement of symptoms. Out of work today and tomorrow, can return 12/21 if all test negative. Follow up with PCP or return to ER for any new or worsening symptoms.     ED Prescriptions   None    PDMP not reviewed this encounter.   Chaney Malling, [place] 08/19/21 1655

## 2021-08-19 NOTE — Discharge Instructions (Addendum)
Increase fluids and rest. Monitor for improvement of symptoms. Out of work today and tomorrow, can return 12/21 if all test negative. Follow up with PCP or return to ER for any new or worsening symptoms.

## 2021-08-20 LAB — SARS CORONAVIRUS 2 (TAT 6-24 HRS): SARS Coronavirus 2: POSITIVE — AB

## 2022-03-18 ENCOUNTER — Ambulatory Visit (HOSPITAL_COMMUNITY)
Admission: RE | Admit: 2022-03-18 | Discharge: 2022-03-18 | Disposition: A | Discharge status: Home / Self Care | Payer: 59 | Source: Ambulatory Visit | Attending: Emergency Medicine | Admitting: Emergency Medicine

## 2022-03-18 ENCOUNTER — Encounter (HOSPITAL_COMMUNITY): Payer: Self-pay

## 2022-03-18 VITALS — BP 136/82 | HR 69 | Temp 98.4°F | Resp 18

## 2022-03-18 DIAGNOSIS — H60391 Other infective otitis externa, right ear: Secondary | ICD-10-CM

## 2022-03-18 MED ORDER — NEOMYCIN-POLYMYXIN-HC 3.5-10000-1 OT SUSP: 4.0000 [drp] | Freq: Three times a day (TID) | OTIC | 0 refills | Status: DC

## 2022-03-18 MED ORDER — IBUPROFEN 800 MG PO TABS: 800.0000 mg | ORAL_TABLET | Freq: Three times a day (TID) | ORAL | 0 refills | Status: DC | PRN

## 2022-03-18 NOTE — ED Triage Notes (Signed)
Pt c/o rt ear pain with feeling of being closed off and on for the past 2 years. States this time its been going on for 5 days. States unable to sleep.

## 2022-03-18 NOTE — ED Provider Notes (Signed)
[facility]    CSN: [id number] Arrival date & time: 03/18/22  1145      History   Chief Complaint Chief Complaint  Patient presents with   Otalgia    HPI Jesus Clay is a 31 y.o. male.   Patient presents with right-sided ear pain and a subjective fever for 5 days.  Ear pain radiates to the head.  has attempted use of ibuprofen which has been minimally helpful.  Endorses symptoms worsen overnight, interfering with sleep.  Denies nasal congestion, sore throat, coughing, shortness of breath, wheezing, ear drainage, ear itching or decreased hearing.  Endorses that he has had reoccurring ear pain intermittently for the last 2 years.    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 No pertinent surgical history.     Home Medications    Prior to Admission medications   Medication Sig Start Date End Date Taking? Authorizing Provider  acetaminophen (TYLENOL) 500 MG tablet Take 1 tablet (500 mg total) by mouth every 6 (six) hours as needed. 09/02/20   Particia Nearing, PA-C  ibuprofen (ADVIL) 800 MG tablet Take 1 tablet (800 mg total) by mouth every 8 (eight) hours as needed. 09/02/20   Particia Nearing, PA-C    Family History Family History  Problem Relation Age of Onset   Diabetes type II Mother    Hypertension Father     Social History Social History   Tobacco Use   Smoking status: Never   Smokeless tobacco: Never  Substance Use Topics   Alcohol use: Never   Drug use: Never     Allergies   Patient has no known allergies.   Review of Systems Review of Systems  Constitutional:  Positive for fever. Negative for activity change, appetite change, chills, diaphoresis, fatigue and unexpected  weight change.  HENT:  Positive for ear pain. Negative for congestion, dental problem, drooling, ear discharge, facial swelling, hearing loss, mouth sores, nosebleeds, postnasal drip, rhinorrhea, sinus pressure, sinus pain, sneezing, sore throat, tinnitus, trouble swallowing and voice change.   Neurological:  Positive for headaches. Negative for dizziness, tremors, seizures, syncope, facial asymmetry, speech difficulty, weakness, light-headedness and numbness.     Physical Exam Triage Vital Signs ED Triage Vitals  Enc Vitals Group     BP 03/18/22 1222 136/82     Pulse Rate 03/18/22 1222 69     Resp 03/18/22 1222 18     Temp 03/18/22 1222 98.4 F (36.9 C)     Temp Source 03/18/22 1222 Oral     SpO2 03/18/22 1222 100 %     Weight --      Height --      Head Circumference --      Peak Flow --      Pain Score 03/18/22 1223 10     Pain Loc --      Pain Edu? --      Excl. in GC? --    No data found.  Updated Vital Signs BP 136/82 (BP Location: Left Arm)   Pulse 69   Temp 98.4 F (36.9 C) (Oral)   Resp 18   SpO2 100%   Visual Acuity Right Eye Distance:   Left Eye Distance:  Bilateral Distance:    Right Eye Near:   Left Eye Near:    Bilateral Near:     Physical Exam Constitutional:      Appearance: Normal appearance.  HENT:     Head: Normocephalic.     Right Ear: External ear normal.     Left Ear: Hearing, tympanic membrane, ear canal and external ear normal.     Ears:     Comments: Ear canal is severely swollen and patient is unable to tolerate exam, able to scantly visualize the tympanic membrane which appears to be pearly gray on presentation, Eyes:     Extraocular Movements: Extraocular movements intact.  Pulmonary:     Effort: Pulmonary effort is normal.  Skin:    General: Skin is warm and dry.  Neurological:     Mental Status: He is alert and oriented to person, place, and time. Mental status is at baseline.  Psychiatric:        Mood and Affect: Mood  normal.        Behavior: Behavior normal.      UC Treatments / Results  Labs (all labs ordered are listed, but only abnormal results are displayed) Labs Reviewed - No data to display  EKG   Radiology No results found.  Procedures Procedures (including critical care time)  Medications Ordered in UC Medications - No data to display  Initial Impression / Assessment and Plan / UC Course  I have reviewed the triage vital signs and the nursing notes.  Pertinent labs & imaging results that were available during my care of the patient were reviewed by me and considered in my medical decision making (see chart for details).  Infective otitis externa of right ear  Cortisporin drops prescribed, discussed administration, advised against any ear cleaning or object placement into the ear canal, prescribed ibuprofen 800 mg, may use additional over-the-counter analgesics for supportive care, may use warm compresses to the external ear in addition, given walker referral to ear nose and throat as patient has endorses reoccurring pain for 2 years, may follow-up with his urgent care as needed Final Clinical Impressions(s) / UC Diagnoses   Final diagnoses:  None   Discharge Instructions   None    ED Prescriptions   None    PDMP not reviewed this encounter.   Valinda Hoar, NP 03/18/22 1301

## 2022-03-18 NOTE — Discharge Instructions (Signed)
Aujourd'hui, vous tes trait pour une infection du conduit auditif,  l'examen, votre oreille est trs enfle au point que je ne suis pas en mesure de l'examiner compltement en raison de Armed forces logistics/support/administrative officer que cela provoquera  Placer 4 gouttes dans l'oreille droite 3 fois par jour (toutes les 8 heures) pendant 7 jours  Vous pouvez utiliser de l'ibuprofne toutes les 8 heures pour Systems developer, vous pouvez utiliser du Tylenol en plus  Peut maintenir des compresses chaudes sur l'oreille pour plus de confort  Microbiologist ne pas essayer de nettoyer les oreilles ou de Immunologist un objet ou un liquide dans le conduit auditif pour viter toute irritation supplmentaire  Vous avez reu des informations sur le spcialiste de Programmer, applications, son numro de tlphone est indiqu sur vos documents, vous pouvez faire un suivi avec lui pour Gustine valuation car vous avez eu des otites rcurrentes.   Today you are being treated for an infection of the ear canal, on exam your ear is very swollen to the point I am not fully able to examine it due to the pain that it will cause  Place 4 drops into the right ear 3 times a day (every 8 hours) for 7 days  You may use ibuprofen every 8 hours to help with your pain, you may use Tylenol in addition  May hold warm compresses to the ear for additional comfort  Please not attempted any ear cleaning or object or fluid placement into the ear canal to prevent further irritation  You have been given information to the ear specialist, their phone number is listed on your paperwork you may follow-up with them for evaluation as you have had reoccurring ear infections

## 2023-02-10 ENCOUNTER — Encounter (HOSPITAL_COMMUNITY): Payer: Self-pay | Admitting: Emergency Medicine

## 2023-02-10 ENCOUNTER — Ambulatory Visit (HOSPITAL_COMMUNITY)
Admission: EM | Admit: 2023-02-10 | Discharge: 2023-02-10 | Disposition: A | Discharge status: Home / Self Care | Payer: Managed Care, Other (non HMO) | Attending: Family Medicine | Admitting: Family Medicine

## 2023-02-10 DIAGNOSIS — B360 Pityriasis versicolor: Secondary | ICD-10-CM | POA: Diagnosis not present

## 2023-02-10 MED ORDER — FLUCONAZOLE 150 MG PO TABS: 300.0000 mg | ORAL_TABLET | ORAL | 0 refills | Status: AC

## 2023-02-10 NOTE — Discharge Instructions (Signed)
Fluconazole 150 mg--take 2 tablets once weekly for 2 doses.  This should treat the rash, and it should continue to improve even after you are done with the medication.  You can call the dermatologist listed in the paperwork if it is not improving

## 2023-02-10 NOTE — ED Provider Notes (Signed)
[facility]    CSN: [id number] Arrival date & time: 02/10/23  1157      History   Chief Complaint Chief Complaint  Patient presents with   Skin Discoloration    HPI Jesus Clay is a 32 y.o. male.   HPI Here for rash that is on his neck and his mid and lower chest.  He has noted about 2 months.  It is itching some.  No fever and no trouble breathing.      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 No pertinent surgical history.     Home Medications    Prior to Admission medications   Medication Sig Start Date End Date Taking? Authorizing Provider  fluconazole (DIFLUCAN) 150 MG tablet Take 2 tablets (300 mg total) by mouth once a week for 2 doses. 02/10/23 02/18/23 Yes Jennyfer Nickolson, Janace Aris, MD  acetaminophen (TYLENOL) 500 MG tablet Take 1 tablet (500 mg total) by mouth every 6 (six) hours as needed. 09/02/20   Particia Nearing, PA-C    Family History Family History  Problem Relation Age of Onset   Diabetes type II Mother    Hypertension Father     Social History Social History   Tobacco Use   Smoking status: Never   Smokeless tobacco: Never  Substance Use Topics   Alcohol use: Never   Drug use: Never     Allergies   Patient has no known allergies.   Review of Systems Review of Systems   Physical Exam Triage Vital Signs ED Triage Vitals [02/10/23 1226]  Enc Vitals Group     BP 132/79     Pulse Rate (!) 59     Resp 14     Temp 97.9 F (36.6 C)     Temp Source Oral     SpO2 98 %     Weight      Height      Head Circumference      Peak Flow      Pain Score 0     Pain Loc      Pain Edu?      Excl. in GC?    No data found.  Updated Vital Signs BP 132/79 (BP Location:  Left Arm)   Pulse (!) 59   Temp 97.9 F (36.6 C) (Oral)   Resp 14   SpO2 98%   Visual Acuity Right Eye Distance:   Left Eye Distance:   Bilateral Distance:    Right Eye Near:   Left Eye Near:    Bilateral Near:     Physical Exam Vitals reviewed.  Constitutional:      General: He is not in acute distress.    Appearance: He is not ill-appearing, toxic-appearing or diaphoretic.  Skin:    Coloration: Skin is not pale.     Comments: There is a spotty fairly flat rash on his right lower neck and on his right lower chest beneath his nipple and then on the center of his anterior chest  Neurological:     Mental Status: He is alert and oriented to person, place, and time.  Psychiatric:        Behavior: Behavior normal.      UC  Treatments / Results  Labs (all labs ordered are listed, but only abnormal results are displayed) Labs Reviewed - No data to display  EKG   Radiology No results found.  Procedures Procedures (including critical care time)  Medications Ordered in UC Medications - No data to display  Initial Impression / Assessment and Plan / UC Course  I have reviewed the triage vital signs and the nursing notes.  Pertinent labs & imaging results that were available during my care of the patient were reviewed by me and considered in my medical decision making (see chart for details).        This is most consistent with tinea versicolor.  Since that is fairly extensive, fluconazole 300 mg to be taken once weekly for 2 weeks is sent into the pharmacy. Final Clinical Impressions(s) / UC Diagnoses   Final diagnoses:  Tinea versicolor     Discharge Instructions      Fluconazole 150 mg--take 2 tablets once weekly for 2 doses.  This should treat the rash, and it should continue to improve even after you are done with the medication.  You can call the dermatologist listed in the paperwork if it is not improving     ED Prescriptions     Medication Sig  Dispense Auth. Provider   fluconazole (DIFLUCAN) 150 MG tablet Take 2 tablets (300 mg total) by mouth once a week for 2 doses. 4 tablet Kymoni Monday, Janace Aris, MD      PDMP not reviewed this encounter.   Zenia Resides, MD 02/10/23 1242

## 2023-02-10 NOTE — ED Triage Notes (Signed)
Pt reports that for a month having skin discoloration to neck and abdomin. Denies itching or pain.
